# Patient Record
Sex: Female | Born: 1970 | Race: White | Hispanic: No | Marital: Married | Smoking: Former smoker
Health system: Southern US, Community
[De-identification: ages and names within clinical notes are randomized; demographics above are authoritative.]

## PROBLEM LIST (undated history)

## (undated) DIAGNOSIS — F319 Bipolar disorder, unspecified: Secondary | ICD-10-CM

## (undated) HISTORY — PX: OTHER SURGICAL HISTORY: SHX169

## (undated) HISTORY — PX: GASTRIC BYPASS: SHX52

---

## 2005-05-02 ENCOUNTER — Other Ambulatory Visit: Admission: RE | Admit: 2005-05-02 | Discharge: 2005-05-02 | Payer: Self-pay | Admitting: Obstetrics and Gynecology

## 2005-08-16 ENCOUNTER — Ambulatory Visit (HOSPITAL_COMMUNITY): Admission: RE | Admit: 2005-08-16 | Discharge: 2005-08-16 | Payer: Self-pay | Admitting: Obstetrics and Gynecology

## 2005-11-13 ENCOUNTER — Inpatient Hospital Stay (HOSPITAL_COMMUNITY): Admission: AD | Admit: 2005-11-13 | Discharge: 2005-11-18 | Payer: Self-pay | Admitting: Obstetrics and Gynecology

## 2005-11-14 ENCOUNTER — Encounter (INDEPENDENT_AMBULATORY_CARE_PROVIDER_SITE_OTHER): Payer: Self-pay | Admitting: Specialist

## 2008-12-04 ENCOUNTER — Ambulatory Visit: Payer: Self-pay | Admitting: Hematology & Oncology

## 2008-12-08 LAB — CBC WITH DIFFERENTIAL (CANCER CENTER ONLY)
Eosinophils Absolute: 0.1 10*3/uL (ref 0.0–0.5)
HCT: 25.8 % — ABNORMAL LOW (ref 34.8–46.6)
HGB: 8.6 g/dL — ABNORMAL LOW (ref 11.6–15.9)
LYMPH#: 1.1 10*3/uL (ref 0.9–3.3)
LYMPH%: 16.7 % (ref 14.0–48.0)
NEUT#: 4.9 10*3/uL (ref 1.5–6.5)

## 2008-12-08 LAB — CHCC SATELLITE - SMEAR

## 2008-12-10 LAB — FERRITIN: Ferritin: 4 ng/mL — ABNORMAL LOW (ref 10–291)

## 2008-12-10 LAB — RETICULOCYTES (CHCC)
RBC.: 3.33 MIL/uL — ABNORMAL LOW (ref 3.87–5.11)
Retic Ct Pct: 1.2 % (ref 0.4–3.1)

## 2008-12-30 ENCOUNTER — Encounter: Admission: RE | Admit: 2008-12-30 | Discharge: 2009-02-17 | Payer: Self-pay | Admitting: Obstetrics and Gynecology

## 2009-01-07 ENCOUNTER — Ambulatory Visit: Payer: Self-pay | Admitting: Hematology & Oncology

## 2009-02-02 LAB — FERRITIN: Ferritin: 16 ng/mL (ref 10–291)

## 2009-02-02 LAB — CBC WITH DIFFERENTIAL (CANCER CENTER ONLY)
BASO#: 0 10*3/uL (ref 0.0–0.2)
EOS%: 1.4 % (ref 0.0–7.0)
Eosinophils Absolute: 0.1 10*3/uL (ref 0.0–0.5)
MCH: 28.1 pg (ref 26.0–34.0)
MCV: 83 fL (ref 81–101)
MONO#: 0.6 10*3/uL (ref 0.1–0.9)
RBC: 3.73 10*6/uL (ref 3.70–5.32)
RDW: 18.3 % — ABNORMAL HIGH (ref 10.5–14.6)
WBC: 7.8 10*3/uL (ref 3.9–10.0)

## 2009-02-13 ENCOUNTER — Ambulatory Visit: Payer: Self-pay | Admitting: Advanced Practice Midwife

## 2009-02-13 ENCOUNTER — Inpatient Hospital Stay (HOSPITAL_COMMUNITY): Admission: AD | Admit: 2009-02-13 | Discharge: 2009-02-13 | Payer: Self-pay | Admitting: Obstetrics and Gynecology

## 2009-03-01 ENCOUNTER — Inpatient Hospital Stay (HOSPITAL_COMMUNITY): Admission: RE | Admit: 2009-03-01 | Discharge: 2009-03-03 | Payer: Self-pay | Admitting: Obstetrics and Gynecology

## 2009-03-19 ENCOUNTER — Ambulatory Visit: Payer: Self-pay | Admitting: Hematology & Oncology

## 2009-03-22 LAB — CBC WITH DIFFERENTIAL (CANCER CENTER ONLY)
Eosinophils Absolute: 0.3 10*3/uL (ref 0.0–0.5)
HGB: 12.6 g/dL (ref 11.6–15.9)
LYMPH#: 2 10*3/uL (ref 0.9–3.3)
MCHC: 32.9 g/dL (ref 32.0–36.0)
MCV: 84 fL (ref 81–101)
MONO#: 0.2 10*3/uL (ref 0.1–0.9)
NEUT#: 3.2 10*3/uL (ref 1.5–6.5)
NEUT%: 55 % (ref 39.6–80.0)

## 2009-03-22 LAB — FERRITIN: Ferritin: 28 ng/mL (ref 10–291)

## 2009-03-22 LAB — RETICULOCYTES (CHCC)
ABS Retic: 13.7 10*3/uL — ABNORMAL LOW (ref 19.0–186.0)
RBC.: 4.57 MIL/uL (ref 3.87–5.11)
Retic Ct Pct: 0.3 % — ABNORMAL LOW (ref 0.4–3.1)

## 2010-05-08 LAB — CBC
HCT: 35.8 % — ABNORMAL LOW (ref 36.0–46.0)
Hemoglobin: 11.7 g/dL — ABNORMAL LOW (ref 12.0–15.0)
MCHC: 33 g/dL (ref 30.0–36.0)
MCV: 86.7 fL (ref 78.0–100.0)
WBC: 7.7 10*3/uL (ref 4.0–10.5)

## 2010-05-23 LAB — COMPREHENSIVE METABOLIC PANEL
AST: 21 U/L (ref 0–37)
Albumin: 2.4 g/dL — ABNORMAL LOW (ref 3.5–5.2)
Alkaline Phosphatase: 93 U/L (ref 39–117)
BUN: 6 mg/dL (ref 6–23)
GFR calc non Af Amer: >60 mL/min (ref >60)
Glucose, Bld: 68 mg/dL — ABNORMAL LOW (ref 70–99)
Potassium: 3.4 mEq/L — ABNORMAL LOW (ref 3.5–5.1)
Total Protein: 4.9 g/dL — ABNORMAL LOW (ref 6.0–8.3)

## 2010-05-23 LAB — URINALYSIS, ROUTINE W REFLEX MICROSCOPIC
Bilirubin Urine: NEGATIVE
Glucose, UA: NEGATIVE mg/dL
Hgb urine dipstick: NEGATIVE
Nitrite: NEGATIVE
Protein, ur: NEGATIVE mg/dL
Urobilinogen, UA: 0.2 mg/dL (ref 0.0–1.0)
pH: 5.5 (ref 5.0–8.0)

## 2010-05-23 LAB — CBC
Hemoglobin: 10.1 g/dL — ABNORMAL LOW (ref 12.0–15.0)
MCV: 87 fL (ref 78.0–100.0)
Platelets: 141 10*3/uL — ABNORMAL LOW (ref 150–400)
WBC: 8.5 10*3/uL (ref 4.0–10.5)

## 2010-07-08 NOTE — Op Note (Signed)
NAME:  Lindsey Lawrence, Lindsey Lawrence      ACCOUNT NO.:  [id number]   MEDICAL RECORD NO.:  [id number]          PATIENT TYPE:  INP   LOCATION:  9145                          FACILITY:  [facility]   PHYSICIAN:  Juluis Mire, M.D.   DATE OF BIRTH:  10/08/1970   DATE OF PROCEDURE:  11/13/2005  DATE OF DISCHARGE:                                 OPERATIVE REPORT   PREOPERATIVE DIAGNOSES:  1. Intrauterine pregnancy at 37 weeks.  2. Pregnancy-induced hypertension.  3. Induction of labor with development of a nonreassuring fetal heart rate      pattern on Pitocin and lack of progression without it.   POSTOPERATIVE DIAGNOSES:  1. Intrauterine pregnancy at 37 weeks.  2. Pregnancy-induced hypertension.  3. Induction of labor with development of a nonreassuring fetal heart rate      pattern on Pitocin and lack of progression without it.   OPERATIVE PROCEDURE:  Low transverse cesarean section.   SURGEON:  Juluis Mire, M.D.   ANESTHESIA:  Spinal.   ESTIMATED BLOOD LOSS:  500 mL.   PACKS AND DRAINS:  None.   INTRAOPERATIVE BLOOD REPLACED:  None.   COMPLICATIONS:  None.   INDICATIONS:  The patient is a 40 year old primigravida female at 37-1/2  weeks.  She had been admitted earlier due to elevated blood pressure and PIH  symptomatology and begun on induction of labor.  This is her second day of  induction after Cytotec through the night.  Pitocin was begun and got up to  10 milliunits.  With this, the infant started having repetitive  decelerations and episodes of bradycardia.  Pitocin was discontinued,  position changes were instituted, oxygen begun.  Exam revealed the cervix to  be still 1 cm, relatively long with the vertex floating.  In view of the  fetal heart rate pattern and lack of progression, we decided to proceed with  primary cesarean section.  The risks were discussed, including the risk of  infection; the risk of hemorrhage that could require transfusion and the  risk of AIDS or  hepatitis; the risk of injury to adjacent organs including  bladder, bowel or ureters, which could require further exploratory surgery;  risk of deep venous thrombosis and pulmonary embolus.  Of note, the fetal  heart rate had returned after the Pitocin had been discontinued and was  running in the 140s.   PROCEDURE:  The patient was taken to the OR and placed in supine position  with a left lateral tilt.  After spinal anesthesia obtained, the abdomen was  prepped out with Betadine and draped as a sterile field.  A low transverse  incision was made with a knife and carried through subcutaneous tissue.  The  fascia was identified and entered sharply and the incision in the fascia  extended laterally.  The fascia taken off the muscle superiorly inferiorly.  The rectus muscles were separated in the midline and the peritoneum was  entered sharply.  The incision in the peritoneum was extended both  superiorly and inferiorly.  A low transverse bladder flap was developed, a  low transverse uterine incision begun with a knife and extended laterally  using Mayo traction.  Amniotic fluid was clear.  The infant was in the  vertex presentation and delivered with elevation of the head and fundal  pressure.  The infant was a viable female.  Apgars were 4, 6 and 9 at one,  five and 10 minutes.  Umbilical artery pH was 7.045, PCO2 was 79.1, bicarb  20.6, base excess was -12.3.  I did have to break scrub as the team was not  in attendance, and I did have to administer assisted ventilation using the  Ambu bag.  With this the infant responded, always had a good heart rate,  tone gradually returned.  This was felt to be probably secondary to  magnesium and a respiratory acidosis.  At this point in time I re-donned  gloves and went back, as the baby was stable, to complete the C-section.  The placenta was delivered manually and sent to pathology.  The uterus was  exteriorized for closure.  It was closed with  an interrupted suture of 0  chromic using a 2-layer closure technique.  We had good hemostasis and clear  urine output.  Tubes and ovaries were unremarkable.  The peritoneum and  muscle were closed with a running suture of 3-0 Vicryl, the fascia closed  with a running suture of 0 PDS.  She had had a previous abdominoplasty, and  there was actually a pocket between the fat in the umbilical area where  evidently the abdominal wall did not adhere to the fascia.  It was difficult  to determine whether this was another layer of fascia.  I went ahead and  closed this to the prior fascial closure with a running suture of 0 PDS.  At  this point in time the subcu was closed with a running suture of 3-0 plain  catgut.  Skin was closed with staples and Steri-Strips.  Sponge, instrument  and needle count were reported as correct by the circulating nurse x2.  The  Foley catheter was clear at the time of closure.  The patient tolerated the  procedure well, was returned to the recovery room in good condition.      Juluis Mire, M.D.  Electronically Signed     JSM/MEDQ  D:  11/15/2005  T:  11/17/2005  Job:  [id number]

## 2010-07-08 NOTE — Discharge Summary (Signed)
NAME:  Lindsey Lawrence, Lindsey Lawrence      ACCOUNT NO.:  [id number]   MEDICAL RECORD NO.:  [id number]          PATIENT TYPE:  INP   LOCATION:  9145                          FACILITY:  [facility]   PHYSICIAN:  Duke Salvia. Marcelle Overlie, M.D.DATE OF BIRTH:  [birth year]   DATE OF ADMISSION:  11/13/2005  DATE OF DISCHARGE:  11/18/2005                                 DISCHARGE SUMMARY   ADMITTING DIAGNOSES:  1. Intrauterine pregnancy at 1 weeks' estimated gestational age.  2. Induction of labor secondary to pregnancy-induced hypertension.   DISCHARGE DIAGNOSES:  1. Status post low transverse cesarean section secondary to nonreassuring      fetal heart tones and the failure to progress.  2. Viable female infant.   PROCEDURE:  Primary low transverse cesarean section.   REASON FOR ADMISSION:  Please see dictated H&P.   HOSPITAL COURSE:  The patient is a 40 year old primigravida who was admitted  to [facility] at 37-1/2 weeks for an induction of labor.  The patient was admitted for elevation in blood pressure and PIH  symptomatology.  The patient underwent Cytotec induction and Pitocin  augmentation of labor.  Fetus was noted to have some repetitive  decelerations and episodes of bradycardia.  Pitocin was discontinued and  position changes were instituted and oxygen administration was given.  Fetus  did recover; however, cervix was reexamined and found to continue to be 1  cm, relatively long, with vertex floating.  Due to lack of progression and  fetal intolerance to labor, decision was made to proceed with a primary low  transverse cesarean section.  The patient was then taken to the operating  room where spinal anesthesia was administered without difficulty.  A low  transverse incision was made with delivery of a viable female infant  weighing 5 pounds 10 ounces with Apgar scores of 4 at 1 minute, 6 at 5  minutes and 9 at 10 minutes.  Arterial pH was 7.045, pCO2 was 79.1 and  bicarb was  20.6.  Base excess was -12.3.  Dr. Arelia Sneddon did break scrub, as the  team was not in attendance at this time and did have to administer assisted  ventilation with Ambu bag; the infant did respond well and always had a good  heart rate and tone were actually did reoccur.  This was thought to be  probably secondary to magnesium and respiratory acidosis.  When baby was  stable, Dr. Arelia Sneddon did re-glove and completed the closure of the cesarean.  The patient did tolerate the procedure well and was later taken to the  recovery room in stable condition.  The patient was then later transferred  to the AICU, where she could be monitored closely.  She continued on  magnesium sulfate.  On the following morning, the patient denied headache,  blurred vision or epigastric pain.  Vital signs were stable.  Blood pressure  was 140s to 150s over 70s to 80s.  Deep tendon reflexes 1 to 2+ without  clonus.  Abdomen soft.  Fundus was firm and nontender.  An abdominal  dressing was noted to have a small amount of drainage on bandage.  The  patient  was noted to be diuresing.  Laboratory findings revealed hemoglobin  of 8.6, platelet count of 221,000, WBC count of 9.9.  LFTs revealed AST of  87, ALT of 37, alkaline phosphatase of 208.  Decision was made to  discontinue the magnesium sulfate and recheck labs in the morning, after  several more hours of observation after discontinuation of magnesium  sulfate.  The patient was later transferred to the mother/baby unit.  On  postoperative day #2, the patient continued to deny headache, blurred vision  or right upper quadrant pain.  Vital signs remained stable.  Blood pressure  was 119/70 to 131/80.  Deep tendon reflexes were 1 to 2+ without clonus.  Abdomen continued be soft with good return of bowel function.  Fundus was  firm and nontender.  Abdominal dressing had been removed, revealing some  slight erythema noted superior to the incisional site.  The incision itself   was clean, dry and intact.  Staples were intact.  Laboratory findings  revealed hemoglobin of 7.1, platelet count of 75,000 and WBC count of 7.4.  Liver function tests were improving with AST of 38, ALT of 25, alkaline  phosphatase was down to 163.  Uric acid was 4.5.  The patient was started on  some iron supplementation.  She was ambulatory without complaints of  dizziness.  On postoperative day #3, the patient was without complaint,  vital signs were stable, blood pressure 134/86, temperature 99.7.  Lungs  were clear to auscultation.  Incision was clean, dry and intact.  SGOT and  SGPT were within normal limits.  Hemoglobin was 7.4, platelet count 185,000,  WBC count of 6.5.  Instructions were reviewed and the patient was later  discharged home.   CONDITION ON DISCHARGE:  Stable.   DIET:  Regular as tolerated.   ACTIVITY:  No heavy lifting, no driving x2 weeks, no vaginal entry.   FOLLOWUP:  The patient is to follow up in the office in 2-3 days for an  incision check and staple removal.  She is to call for temperature greater  than 100 degrees, persistent nausea, vomiting, heavy vaginal bleeding and/or  redness or drainage from the incisional site.  The patient was also  instructed call for headache, blurred vision, increase in pedal edema or  epigastric discomfort.   DISCHARGE MEDICATIONS:  1. Tylox, #30, one p.o. every 4-6 hours p.r.n.  2. Motrin OTC as needed for pain,  3. Prenatal vitamins one p.o. daily.      Julio Sicks, N.P.      Richard M. Marcelle Overlie, M.D.  Electronically Signed    CC/MEDQ  D:  12/06/2005  T:  12/07/2005  Job:  [id number]

## 2010-10-31 ENCOUNTER — Other Ambulatory Visit: Payer: Self-pay | Admitting: Obstetrics and Gynecology

## 2013-02-20 ENCOUNTER — Encounter: Payer: Self-pay | Admitting: Emergency Medicine

## 2013-02-20 ENCOUNTER — Emergency Department (INDEPENDENT_AMBULATORY_CARE_PROVIDER_SITE_OTHER)
Admission: EM | Admit: 2013-02-20 | Discharge: 2013-02-20 | Disposition: A | Discharge status: Home / Self Care | Payer: 59 | Source: Home / Self Care | Attending: Family Medicine | Admitting: Family Medicine

## 2013-02-20 DIAGNOSIS — J020 Streptococcal pharyngitis: Secondary | ICD-10-CM

## 2013-02-20 HISTORY — DX: Bipolar disorder, unspecified: F31.9

## 2013-02-20 LAB — POCT RAPID STREP A (OFFICE): Rapid Strep A Screen: POSITIVE — AB

## 2013-02-20 MED ORDER — AZITHROMYCIN 250 MG PO TABS: ORAL_TABLET | ORAL | Status: DC

## 2013-02-20 NOTE — ED Notes (Signed)
Pt c/o sore throat and fever x 3 days. Denies coughing or sneezing. She took Advil at 0600.

## 2013-02-20 NOTE — ED Provider Notes (Signed)
CSN: [id number]     Arrival date & time 02/20/13  4098 History   First MD Initiated Contact with Patient 02/20/13 [phone]     Chief Complaint  Patient presents with  . Sore Throat    HPI  SORE THROAT  Onset: 4-5 days  Description: sore throat, fever  Modifying factors: + strep contact   Symptoms  Fever:  yes URI symptoms: no Cough: no Headache: no Rash:  no Swollen glands:   yes Recent Strep Exposure: yes LUQ pain: no Heartburn/brash: no Allergy Symptoms: no  Red Flags STD exposure: no Breathing difficulty: no Drooling: no Trismus: no   Past Medical History  Diagnosis Date  . Bipolar 1 disorder    Past Surgical History  Procedure Laterality Date  . Cesarean section    . Gastric bypass    . Excess skin removal     History reviewed. No pertinent family history. History  Substance Use Topics  . Smoking status: Not on file  . Smokeless tobacco: Not on file  . Alcohol Use: Not on file   OB History   No data available     Review of Systems  All other systems reviewed and are negative.    Allergies  Review of patient's allergies indicates not on file.  Home Medications   Current Outpatient Rx  Name  Route  Sig  Dispense  Refill  . ALPRAZolam (XANAX) 1 MG tablet   Oral   Take 1 mg by mouth at bedtime as needed for anxiety.         . lamoTRIgine (LAMICTAL) 200 MG tablet   Oral   Take 300 mg by mouth daily.         . Lurasidone HCl (LATUDA) 60 MG TABS   Oral   Take by mouth.         . MULTIPLE VITAMIN PO   Oral   Take by mouth.         Marland Kitchen azithromycin (ZITHROMAX) 250 MG tablet      Take 2 tabs PO x 1 dose, then 1 tab PO QD x 4 days   6 tablet   0    BP 138/64  Pulse 94  Temp(Src) 99 F (37.2 C) (Oral)  Resp 18  Ht 5' 4.5" (1.638 m)  Wt 163 lb (73.936 kg)  BMI 27.56 kg/m2  SpO2 97% Physical Exam  Constitutional: She appears well-developed and well-nourished.  HENT:  Head: Normocephalic and atraumatic.  Right Ear: External  ear normal.  Left Ear: External ear normal.  Mouth/Throat: Oropharyngeal exudate present.  Eyes: Conjunctivae are normal. Pupils are equal, round, and reactive to light.  Neck: Normal range of motion.  Cardiovascular: Normal rate and regular rhythm.   Pulmonary/Chest: Effort normal and breath sounds normal.  Abdominal: Soft.  Musculoskeletal: Normal range of motion.  Lymphadenopathy:    She has cervical adenopathy.  Neurological: She is alert.  Skin: Skin is warm.    ED Course  Procedures (including critical care time) Labs Review Labs Reviewed - No data to display Imaging Review No results found.  EKG Interpretation    Date/Time:    Ventricular Rate:    PR Interval:    QRS Duration:   QT Interval:    QTC Calculation:   R Axis:     Text Interpretation:              MDM   1. Strep throat    Rapid strep positive Will treat with zpak (PCN  allergy).  Discussed infectious and ENT red flags as well as sick contact precautions.  Follow up as needed.     The patient and/or caregiver has been counseled thoroughly with regard to treatment plan and/or medications prescribed including dosage, schedule, interactions, rationale for use, and possible side effects and they verbalize understanding. Diagnoses and expected course of recovery discussed and will return if not improved as expected or if the condition worsens. Patient and/or caregiver verbalized understanding.          Doree AlbeeSteven Suhaila Troiano, MD 02/20/13 [phone]

## 2013-07-07 ENCOUNTER — Emergency Department
Admission: EM | Admit: 2013-07-07 | Discharge: 2013-07-07 | Discharge status: Absent without leave | Payer: 59 | Source: Home / Self Care

## 2014-06-18 ENCOUNTER — Other Ambulatory Visit: Payer: Self-pay | Admitting: Obstetrics and Gynecology

## 2014-06-19 LAB — CYTOLOGY - PAP

## 2017-07-11 ENCOUNTER — Other Ambulatory Visit: Payer: Self-pay | Admitting: Obstetrics and Gynecology

## 2020-05-25 ENCOUNTER — Other Ambulatory Visit: Payer: Self-pay | Admitting: Obstetrics and Gynecology

## 2020-05-25 DIAGNOSIS — R928 Other abnormal and inconclusive findings on diagnostic imaging of breast: Secondary | ICD-10-CM

## 2020-06-11 ENCOUNTER — Other Ambulatory Visit: Payer: Self-pay

## 2020-06-11 ENCOUNTER — Ambulatory Visit: Payer: Self-pay

## 2020-06-11 ENCOUNTER — Ambulatory Visit
Admission: RE | Admit: 2020-06-11 | Discharge: 2020-06-11 | Disposition: A | Discharge status: Home / Self Care | Payer: BC Managed Care – PPO | Source: Ambulatory Visit | Attending: Obstetrics and Gynecology | Admitting: Obstetrics and Gynecology

## 2020-06-11 DIAGNOSIS — R928 Other abnormal and inconclusive findings on diagnostic imaging of breast: Secondary | ICD-10-CM

## 2020-06-15 ENCOUNTER — Other Ambulatory Visit: Payer: Self-pay

## 2021-06-28 ENCOUNTER — Other Ambulatory Visit: Payer: Self-pay | Admitting: Obstetrics and Gynecology

## 2021-06-28 DIAGNOSIS — Z1231 Encounter for screening mammogram for malignant neoplasm of breast: Secondary | ICD-10-CM

## 2021-06-30 ENCOUNTER — Ambulatory Visit
Admission: RE | Admit: 2021-06-30 | Discharge: 2021-06-30 | Disposition: A | Discharge status: Home / Self Care | Payer: BC Managed Care – PPO | Source: Ambulatory Visit | Attending: Obstetrics and Gynecology | Admitting: Obstetrics and Gynecology

## 2021-06-30 ENCOUNTER — Encounter: Payer: Self-pay | Admitting: Radiology

## 2021-06-30 DIAGNOSIS — Z1231 Encounter for screening mammogram for malignant neoplasm of breast: Secondary | ICD-10-CM

## 2022-01-16 ENCOUNTER — Encounter: Payer: Self-pay | Admitting: Surgical

## 2022-01-16 ENCOUNTER — Telehealth: Payer: Self-pay | Admitting: Plastic Surgery

## 2022-01-16 NOTE — Telephone Encounter (Signed)
Left 2nd voicemail for pt to call office to reschedule appt with Dr. Ulice Bold.

## 2022-01-16 NOTE — Telephone Encounter (Signed)
LVM for pt to call to reschedule appt for filler consult with Dr. Ulice Bold.

## 2022-02-03 ENCOUNTER — Encounter: Payer: Self-pay | Admitting: Plastic Surgery

## 2022-02-03 ENCOUNTER — Ambulatory Visit (INDEPENDENT_AMBULATORY_CARE_PROVIDER_SITE_OTHER): Payer: Self-pay | Admitting: Plastic Surgery

## 2022-02-03 VITALS — BP 130/83 | HR 69

## 2022-02-03 DIAGNOSIS — Z719 Counseling, unspecified: Secondary | ICD-10-CM

## 2022-02-03 NOTE — Progress Notes (Signed)
Patient ID: Lindsey Lawrence, female    DOB: [birth year], 51 y.o.   MRN: [id number]   Chief Complaint  Patient presents with   Cosmetic Visit    The patient is a 51 year old female here for evaluation of her face.  She is interested in facial rejuvenation.  She has slight signs of rosacea and lots of hyperpigmentation.  She is a Fitzpatrick 1.  She also does not like the circles under her eyes and is interested in seeing if those could be improved.  She has a very nice jawline and very little in the way of jowls or nasolabial folds.    Review of Systems  Constitutional: Negative.   HENT: Negative.    Eyes: Negative.   Respiratory: Negative.  Negative for chest tightness and shortness of breath.   Cardiovascular: Negative.   Gastrointestinal: Negative.   Endocrine: Negative.   Genitourinary: Negative.   Musculoskeletal: Negative.     Past Medical History:  Diagnosis Date   Bipolar 1 disorder (HCC)     Past Surgical History:  Procedure Laterality Date   CESAREAN SECTION     excess skin removal     GASTRIC BYPASS        Current Outpatient Medications:    ALPRAZolam (XANAX) 1 MG tablet, Take 1 mg by mouth at bedtime as needed for anxiety., Disp: , Rfl:    eszopiclone (LUNESTA) 2 MG TABS tablet, Take 2 mg by mouth at bedtime as needed., Disp: , Rfl:    lamoTRIgine (LAMICTAL) 200 MG tablet, Take 300 mg by mouth daily., Disp: , Rfl:    MULTIPLE VITAMIN PO, Take by mouth., Disp: , Rfl:    Naltrexone HCl, Pain, 4.5 MG CAPS, Take 1 capsule by mouth every evening., Disp: , Rfl:    Objective:   Vitals:   02/03/22 0851  BP: 130/83  Pulse: 69  SpO2: 99%    Physical Exam Vitals reviewed.  Constitutional:      Appearance: Normal appearance.  Cardiovascular:     Rate and Rhythm: Normal rate.  Pulmonary:     Effort: Pulmonary effort is normal.  Abdominal:     General: There is no distension.  Skin:    General: Skin is warm.     Capillary Refill: Capillary refill  takes less than 2 seconds.     Coloration: Skin is not jaundiced.  Neurological:     Mental Status: She is alert and oriented to person, place, and time.  Psychiatric:        Mood and Affect: Mood normal.        Behavior: Behavior normal.        Thought Content: Thought content normal.        Judgment: Judgment normal.     Assessment & Plan:  Encounter for counseling  Filler Injection Procedure Note  Procedure:  Filler administration  Pre-operative Diagnosis: Rytides   Post-operative Diagnosis: Same  Surgeon: Electronically signed by: Wayland Denis, DO   Complications:  None  Brief history: The patient desires injection with fillers in her face. I discussed with the patient this proposed procedure of filler injections, which is customized depending on the particular needs of the patient. It is performed on facial volume loss as a temporary correction. The alternatives were discussed with the patient. The risks were addressed including bleeding, scarring, infection, damage to deeper structures, asymmetry, and chronic pain, which may occur infrequently after a procedure. The individual's choice to undergo a surgical procedure is based  on the comparison of risks to potential benefits. Other risks include unsatisfactory results, allergic reaction, which should go away with time, bruising and delayed healing. Fillers do not arrest the aging process or produce permanent tightening.  Operative intervention maybe necessary to maintain the results. The patient understands and wishes to proceed. An informed consent was signed and informational brochures given to her prior to the procedure.  Procedure: The area was prepped with chlorhexidine and dried with a clean gauze. Using a clean technique, the midface area was injected at the medial sub-region of the mid-face.  Half syringe on each side. No complications were noted. Light pressure was held for 5 minutes. She was instructed explicitly in  post-operative care.  Voluma XC 1 ml LOT: [id number]  Pictures were obtained of the patient and placed in the chart with the patient's or guardian's permission.   [place], DO

## 2022-02-15 ENCOUNTER — Telehealth: Payer: Self-pay | Admitting: Plastic Surgery

## 2022-02-15 NOTE — Telephone Encounter (Signed)
LVM for pt to call to schedule a n/c consult for facial with our aesthetician, Phebe Colla, if she would like.

## 2022-02-22 ENCOUNTER — Other Ambulatory Visit: Payer: Self-pay | Admitting: Obstetrics and Gynecology

## 2022-02-22 ENCOUNTER — Encounter: Payer: Self-pay | Admitting: Obstetrics and Gynecology

## 2022-02-22 DIAGNOSIS — Z1231 Encounter for screening mammogram for malignant neoplasm of breast: Secondary | ICD-10-CM

## 2022-05-09 ENCOUNTER — Institutional Professional Consult (permissible substitution): Payer: BC Managed Care – PPO | Admitting: Plastic Surgery

## 2022-07-03 ENCOUNTER — Ambulatory Visit: Payer: BC Managed Care – PPO

## 2022-10-10 IMAGING — MG MM DIGITAL SCREENING BILAT W/ TOMO AND CAD
8 series · 8 of 24 positions shown · non-contrast
Comparison: Previous exam(s).

CLINICAL DATA: Screening.

EXAM:
DIGITAL SCREENING BILATERAL MAMMOGRAM WITH TOMOSYNTHESIS AND CAD
TECHNIQUE: Bilateral screening digital craniocaudal and mediolateral oblique
mammograms were obtained. Bilateral screening digital breast
tomosynthesis was performed. The images were evaluated with
computer-aided detection.

[L CC synth-2D]
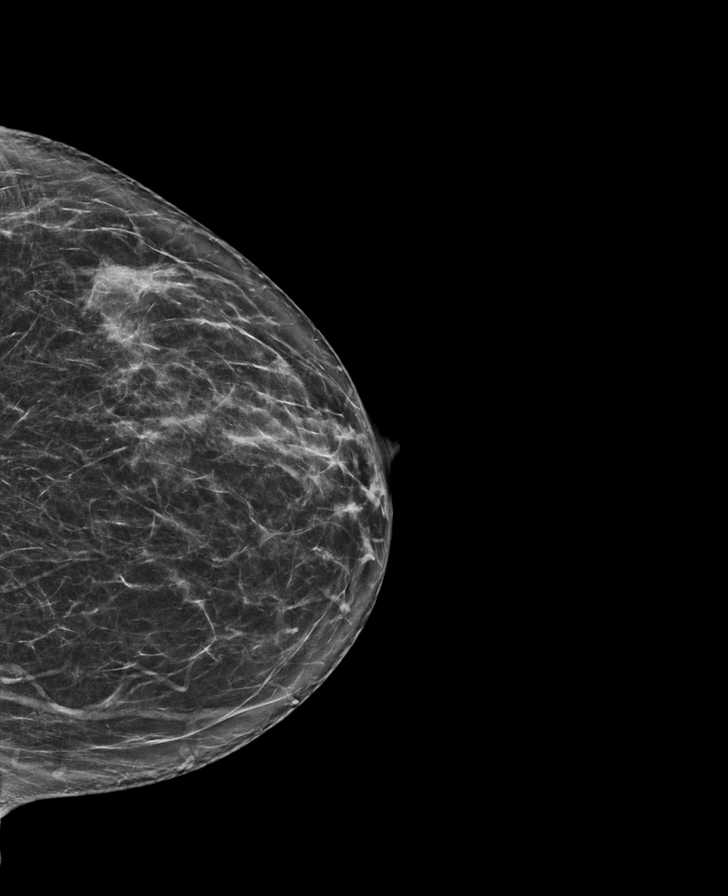

[R CC synth-2D]
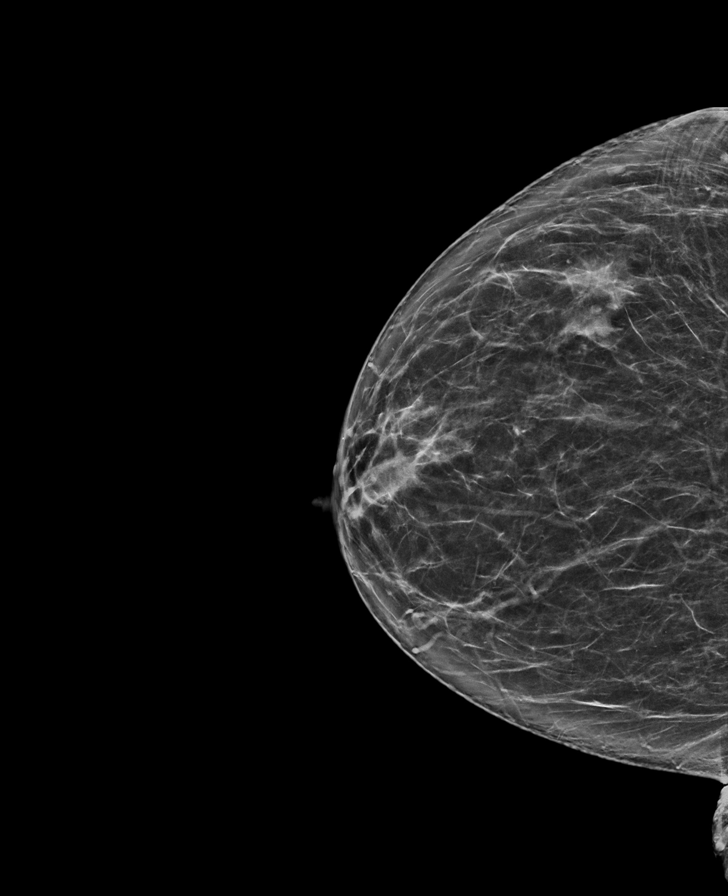

[L MLO synth-2D]
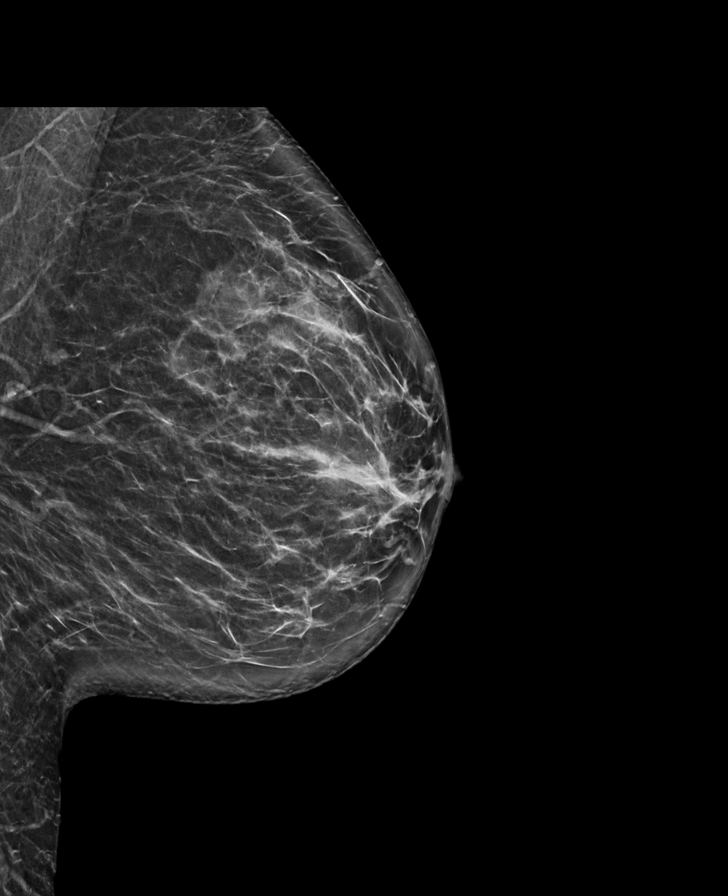

[R MLO synth-2D]
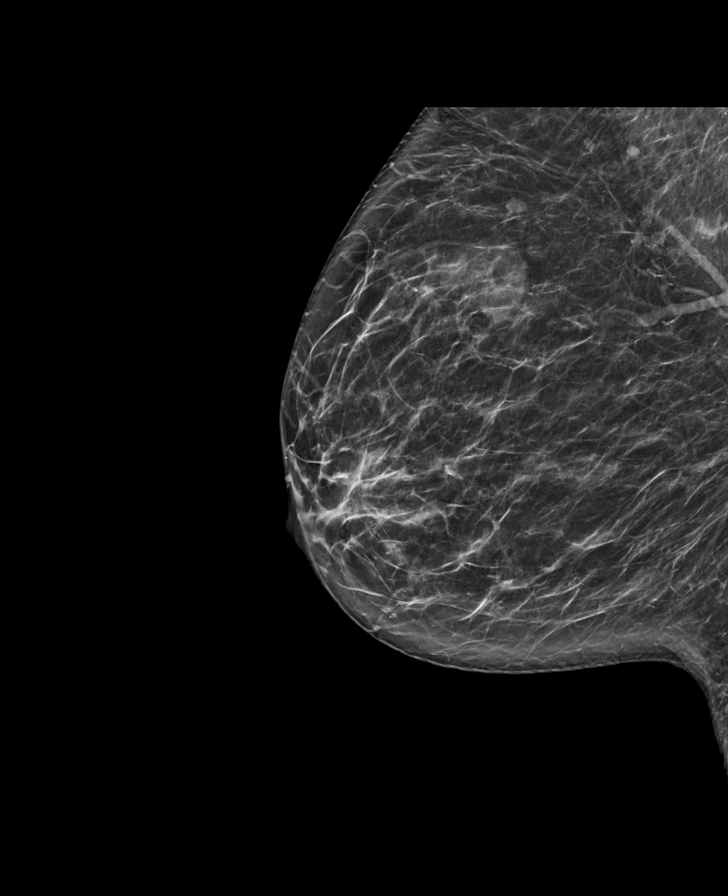

[R CC tomo · tomo slice 39/76.0]
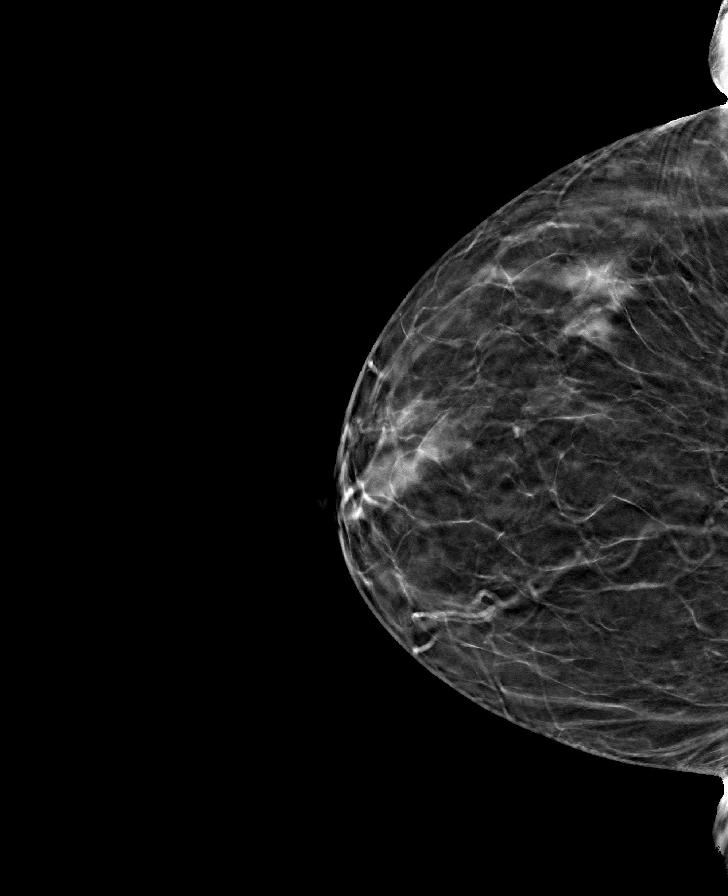

[R MLO tomo · tomo slice 39/76.0]
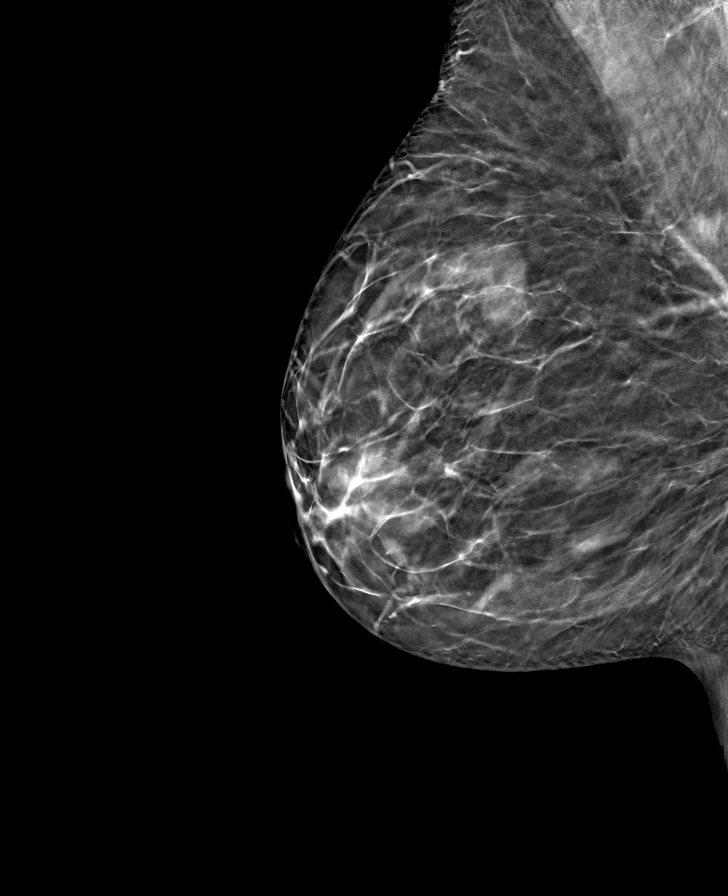

[L MLO tomo · tomo slice 41/81.0]
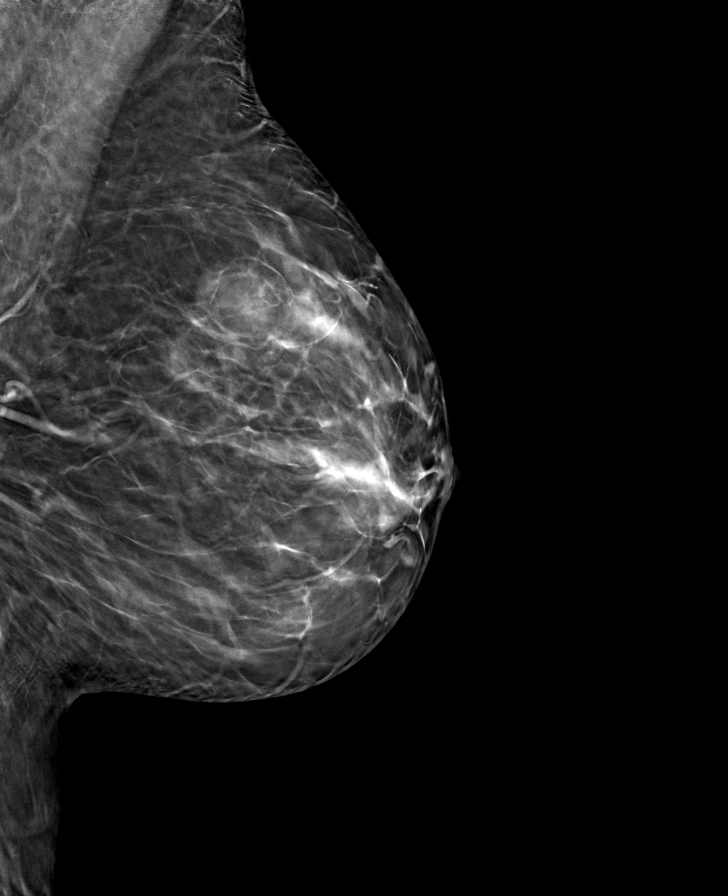

[L CC tomo · tomo slice 38/75.0]
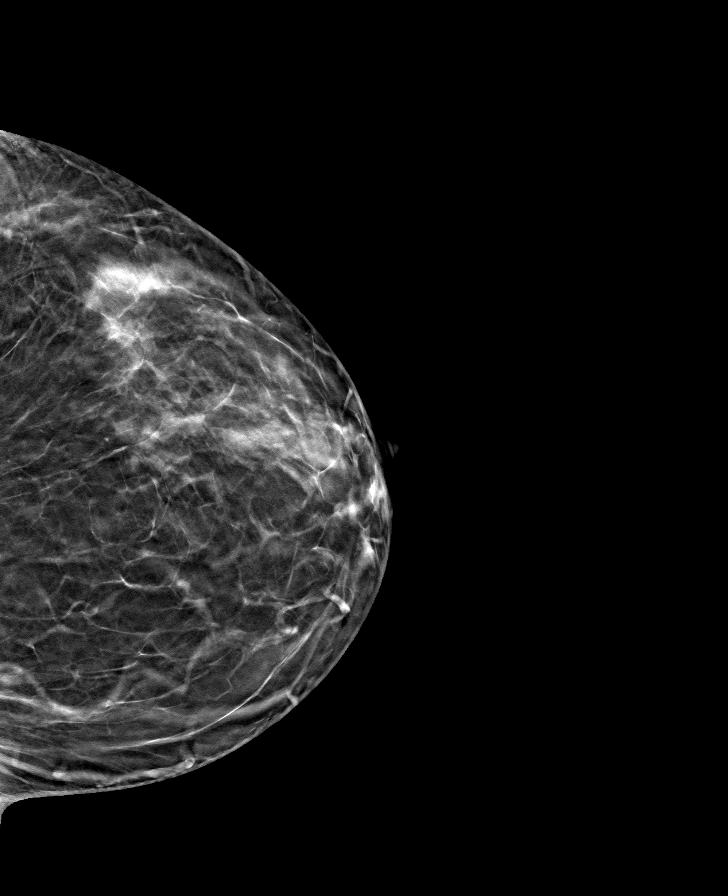

[8 of 24 positions shown; findings below may reference images not displayed]

ACR Breast Density Category b: There are scattered areas of
fibroglandular density.
FINDINGS: There are no findings suspicious for malignancy.
IMPRESSION: No mammographic evidence of malignancy. A result letter of this
screening mammogram will be mailed directly to the patient.

RECOMMENDATION:
Screening mammogram in one year. (Code:51-O-LD2)

BI-RADS CATEGORY  1: Negative.

## 2023-09-07 ENCOUNTER — Encounter: Payer: Self-pay | Admitting: Advanced Practice Midwife
